# Patient Record
Sex: Female | Born: 1970 | Race: Asian | Hispanic: No | Marital: Married | Smoking: Never smoker
Health system: Southern US, Community
[De-identification: ages and names within clinical notes are randomized; demographics above are authoritative.]

## PROBLEM LIST (undated history)

## (undated) DIAGNOSIS — I10 Essential (primary) hypertension: Secondary | ICD-10-CM

## (undated) HISTORY — DX: Essential (primary) hypertension: I10

---

## 2005-08-19 ENCOUNTER — Inpatient Hospital Stay (HOSPITAL_COMMUNITY): Admission: AD | Admit: 2005-08-19 | Discharge: 2005-08-21 | Payer: Self-pay | Admitting: *Deleted

## 2006-01-04 ENCOUNTER — Emergency Department (HOSPITAL_COMMUNITY): Admission: AD | Admit: 2006-01-04 | Discharge: 2006-01-04 | Payer: Self-pay | Admitting: Family Medicine

## 2006-01-31 ENCOUNTER — Emergency Department (HOSPITAL_COMMUNITY): Admission: EM | Admit: 2006-01-31 | Discharge: 2006-01-31 | Payer: Self-pay | Admitting: Family Medicine

## 2006-05-09 ENCOUNTER — Emergency Department (HOSPITAL_COMMUNITY): Admission: EM | Admit: 2006-05-09 | Discharge: 2006-05-09 | Payer: Self-pay | Admitting: Family Medicine

## 2006-09-26 ENCOUNTER — Emergency Department (HOSPITAL_COMMUNITY): Admission: EM | Admit: 2006-09-26 | Discharge: 2006-09-26 | Payer: Self-pay | Admitting: Emergency Medicine

## 2006-10-30 ENCOUNTER — Emergency Department (HOSPITAL_COMMUNITY): Admission: EM | Admit: 2006-10-30 | Discharge: 2006-10-30 | Payer: Self-pay | Admitting: Family Medicine

## 2006-11-02 ENCOUNTER — Emergency Department (HOSPITAL_COMMUNITY): Admission: EM | Admit: 2006-11-02 | Discharge: 2006-11-02 | Payer: Self-pay | Admitting: Emergency Medicine

## 2010-01-02 ENCOUNTER — Emergency Department (HOSPITAL_COMMUNITY): Admission: EM | Admit: 2010-01-02 | Discharge: 2010-01-02 | Payer: Self-pay | Admitting: Emergency Medicine

## 2010-05-30 HISTORY — PX: ANKLE SURGERY: SHX546

## 2010-06-22 ENCOUNTER — Inpatient Hospital Stay (HOSPITAL_COMMUNITY): Admission: EM | Admit: 2010-06-22 | Discharge: 2010-06-24 | Payer: Self-pay | Admitting: Emergency Medicine

## 2010-12-12 LAB — CBC
HCT: 41.7 % (ref 36.0–46.0)
Hemoglobin: 12.2 g/dL (ref 12.0–15.0)
Hemoglobin: 12.9 g/dL (ref 12.0–15.0)
Hemoglobin: 14 g/dL (ref 12.0–15.0)
MCH: 26.1 pg (ref 26.0–34.0)
MCH: 26.2 pg (ref 26.0–34.0)
MCH: 26.5 pg (ref 26.0–34.0)
MCHC: 32.6 g/dL (ref 30.0–36.0)
MCHC: 33.1 g/dL (ref 30.0–36.0)
MCHC: 33.6 g/dL (ref 30.0–36.0)
MCV: 78.8 fL (ref 78.0–100.0)
Platelets: 241 10*3/uL (ref 150–400)

## 2010-12-12 LAB — PROTIME-INR
Prothrombin Time: 13.4 seconds (ref 11.6–15.2)
Prothrombin Time: 19.9 seconds — ABNORMAL HIGH (ref 11.6–15.2)

## 2010-12-12 LAB — BASIC METABOLIC PANEL
BUN: 3 mg/dL — ABNORMAL LOW (ref 6–23)
CO2: 25 mEq/L (ref 19–32)
CO2: 26 mEq/L (ref 19–32)
Calcium: 8.4 mg/dL (ref 8.4–10.5)
Creatinine, Ser: 0.59 mg/dL (ref 0.4–1.2)
Glucose, Bld: 103 mg/dL — ABNORMAL HIGH (ref 70–99)
Glucose, Bld: 158 mg/dL — ABNORMAL HIGH (ref 70–99)
Potassium: 3.6 mEq/L (ref 3.5–5.1)
Sodium: 135 mEq/L (ref 135–145)

## 2011-04-28 ENCOUNTER — Other Ambulatory Visit: Payer: Self-pay | Admitting: Obstetrics & Gynecology

## 2011-04-28 DIAGNOSIS — Z1231 Encounter for screening mammogram for malignant neoplasm of breast: Secondary | ICD-10-CM

## 2011-05-30 ENCOUNTER — Ambulatory Visit
Admission: RE | Admit: 2011-05-30 | Discharge: 2011-05-30 | Disposition: A | Discharge status: Home / Self Care | Payer: BC Managed Care – PPO | Source: Ambulatory Visit | Attending: Obstetrics & Gynecology | Admitting: Obstetrics & Gynecology

## 2011-05-30 DIAGNOSIS — Z1231 Encounter for screening mammogram for malignant neoplasm of breast: Secondary | ICD-10-CM

## 2011-08-12 IMAGING — CR DG TIBIA/FIBULA 2V*R*
2 series · 2 of 2 positions shown · non-contrast
Comparison: None.

CLINICAL DATA: Martial arts injury, fell, pain

RIGHT TIBIA AND FIBULA - 2 VIEW

[view not recorded (1 of 2)]
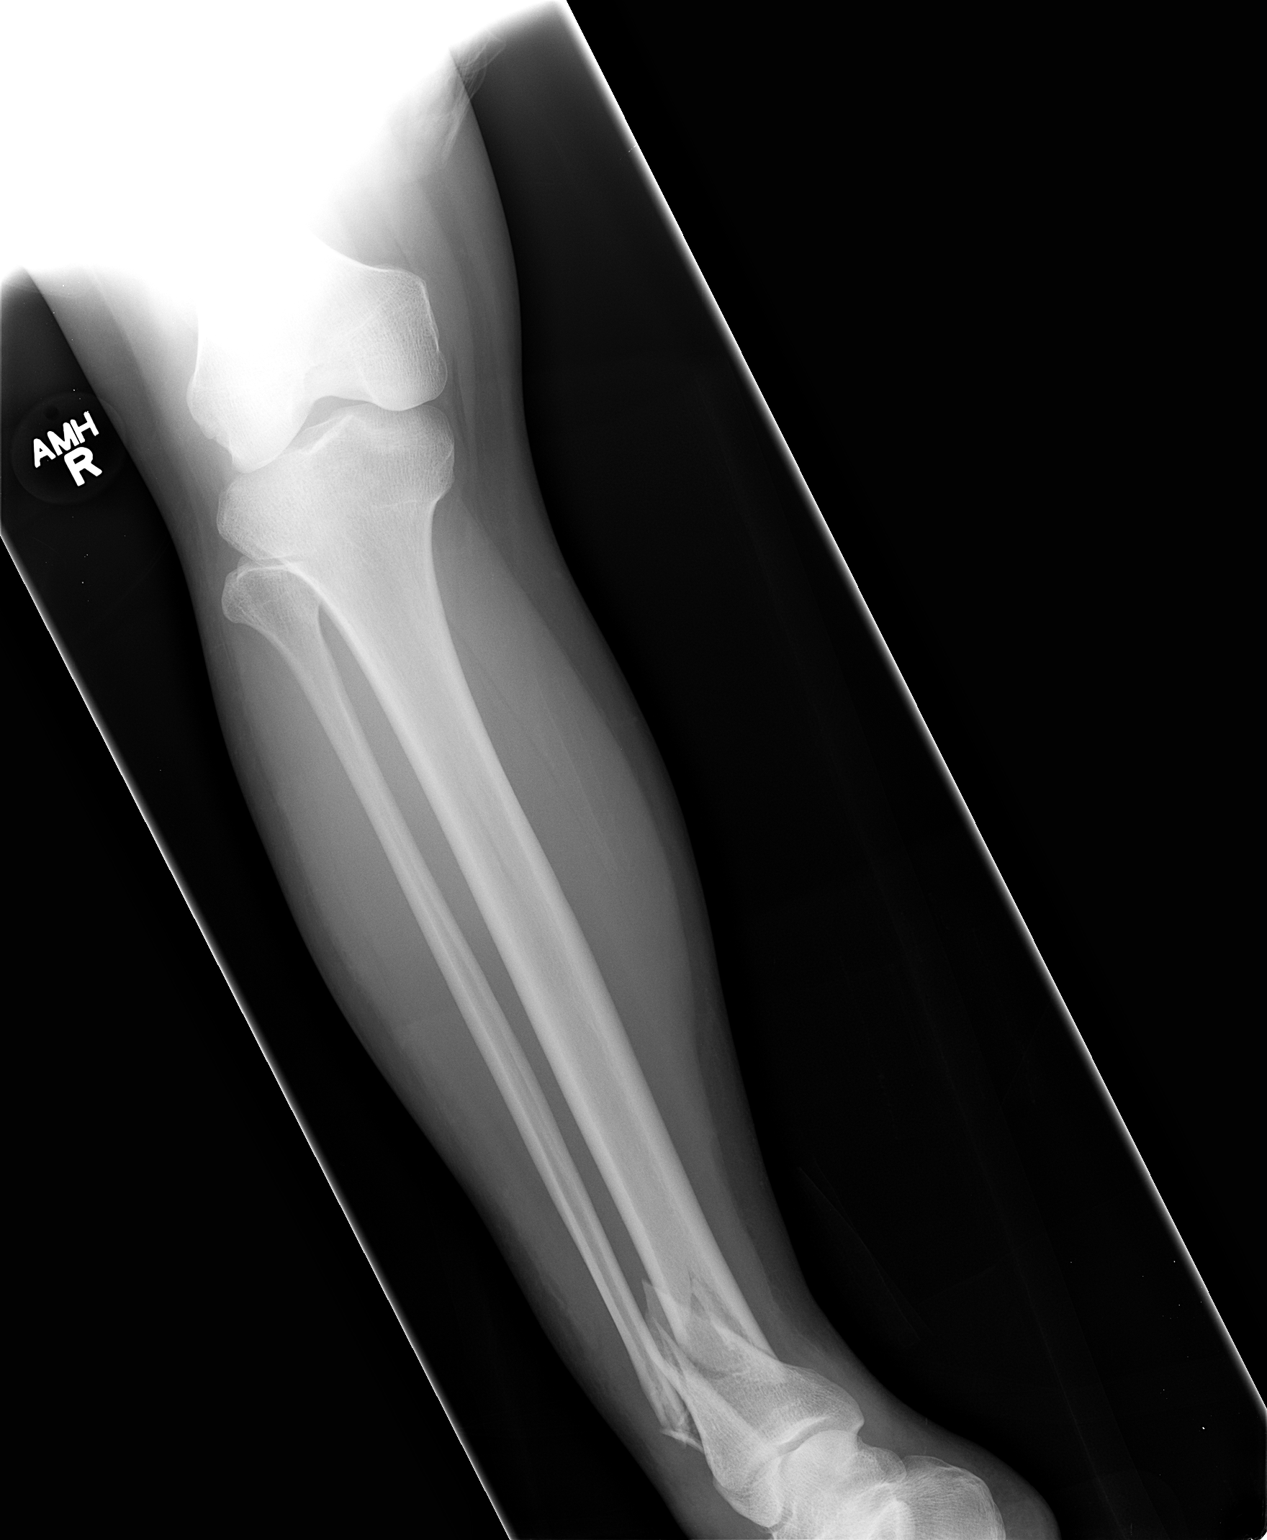

[view not recorded (2 of 2)]
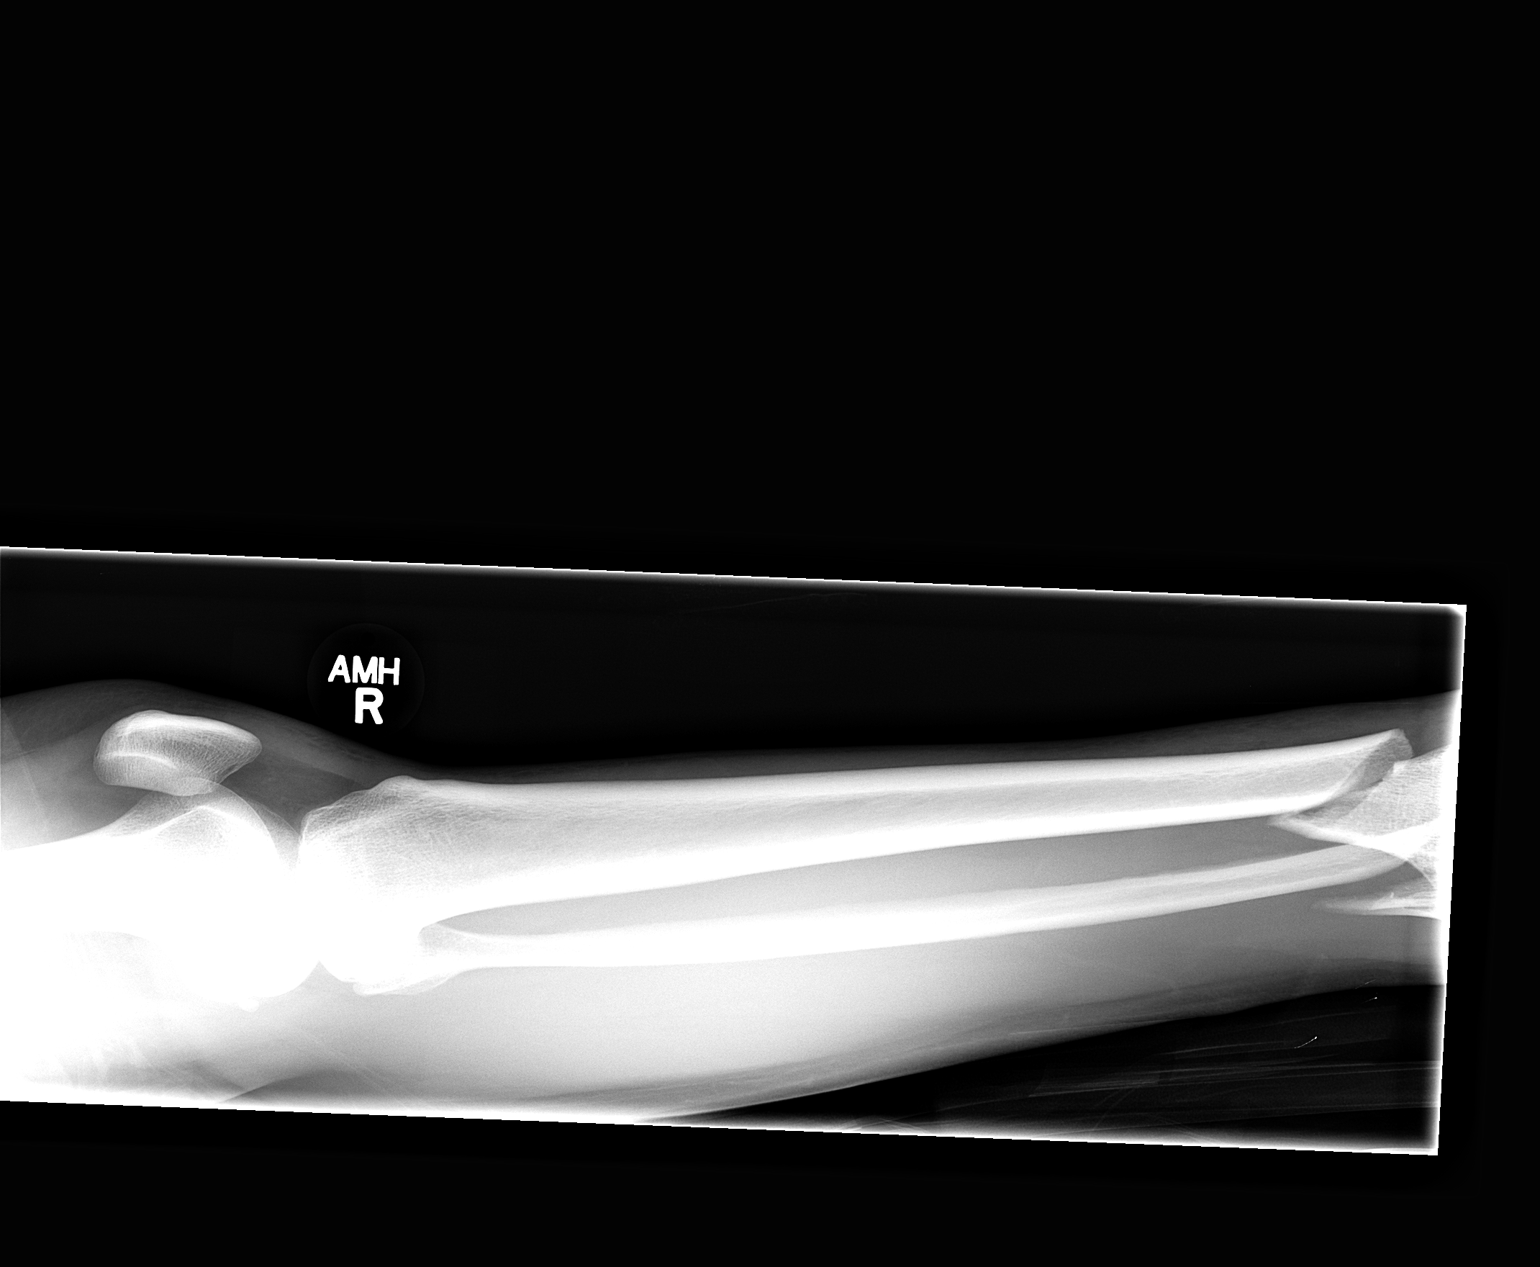

[2 of 2 positions shown; findings below may reference images not displayed]

FINDINGS: There are distal tibial and fibular fractures which are
comminuted and angulated.  Soft tissue swelling is present.
IMPRESSION: As above.

## 2011-08-12 IMAGING — CR DG ANKLE 2V *R*
2 series · 2 of 2 positions shown · non-contrast
Comparison: None.

CLINICAL DATA: Fell, pain

RIGHT ANKLE - 2 VIEW

[view not recorded (1 of 2)]
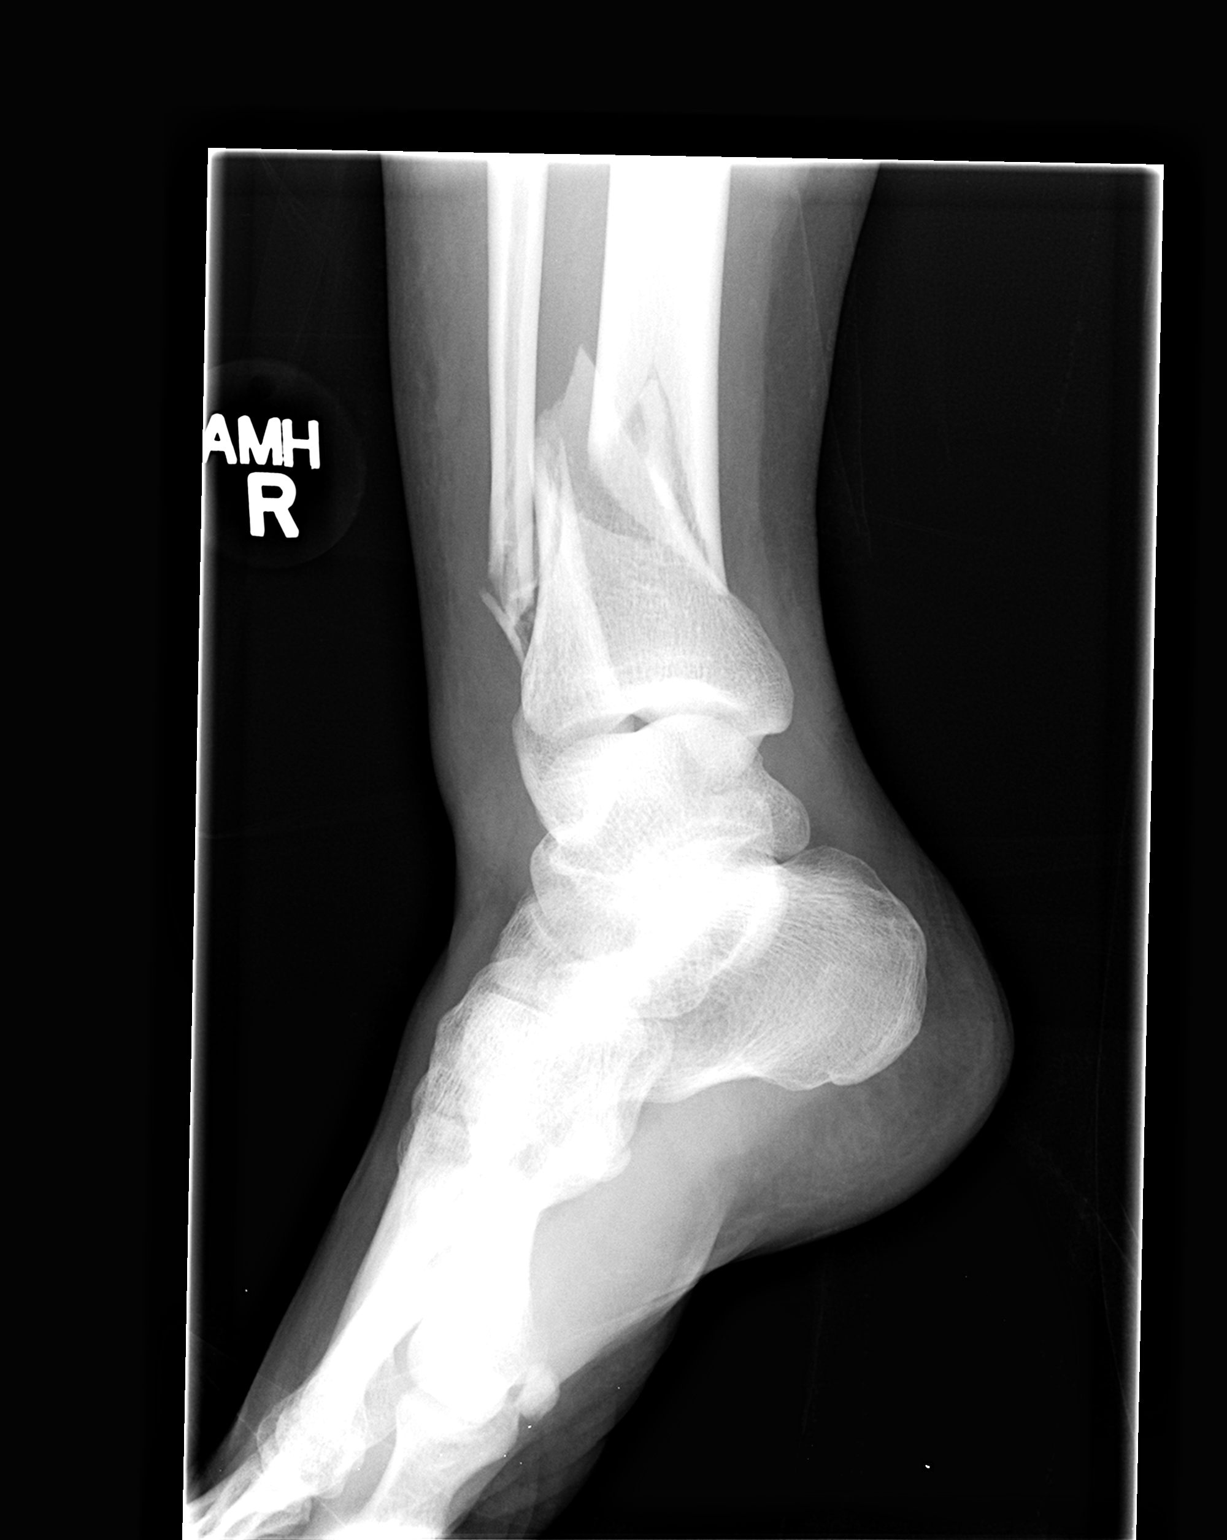

[view not recorded (2 of 2)]
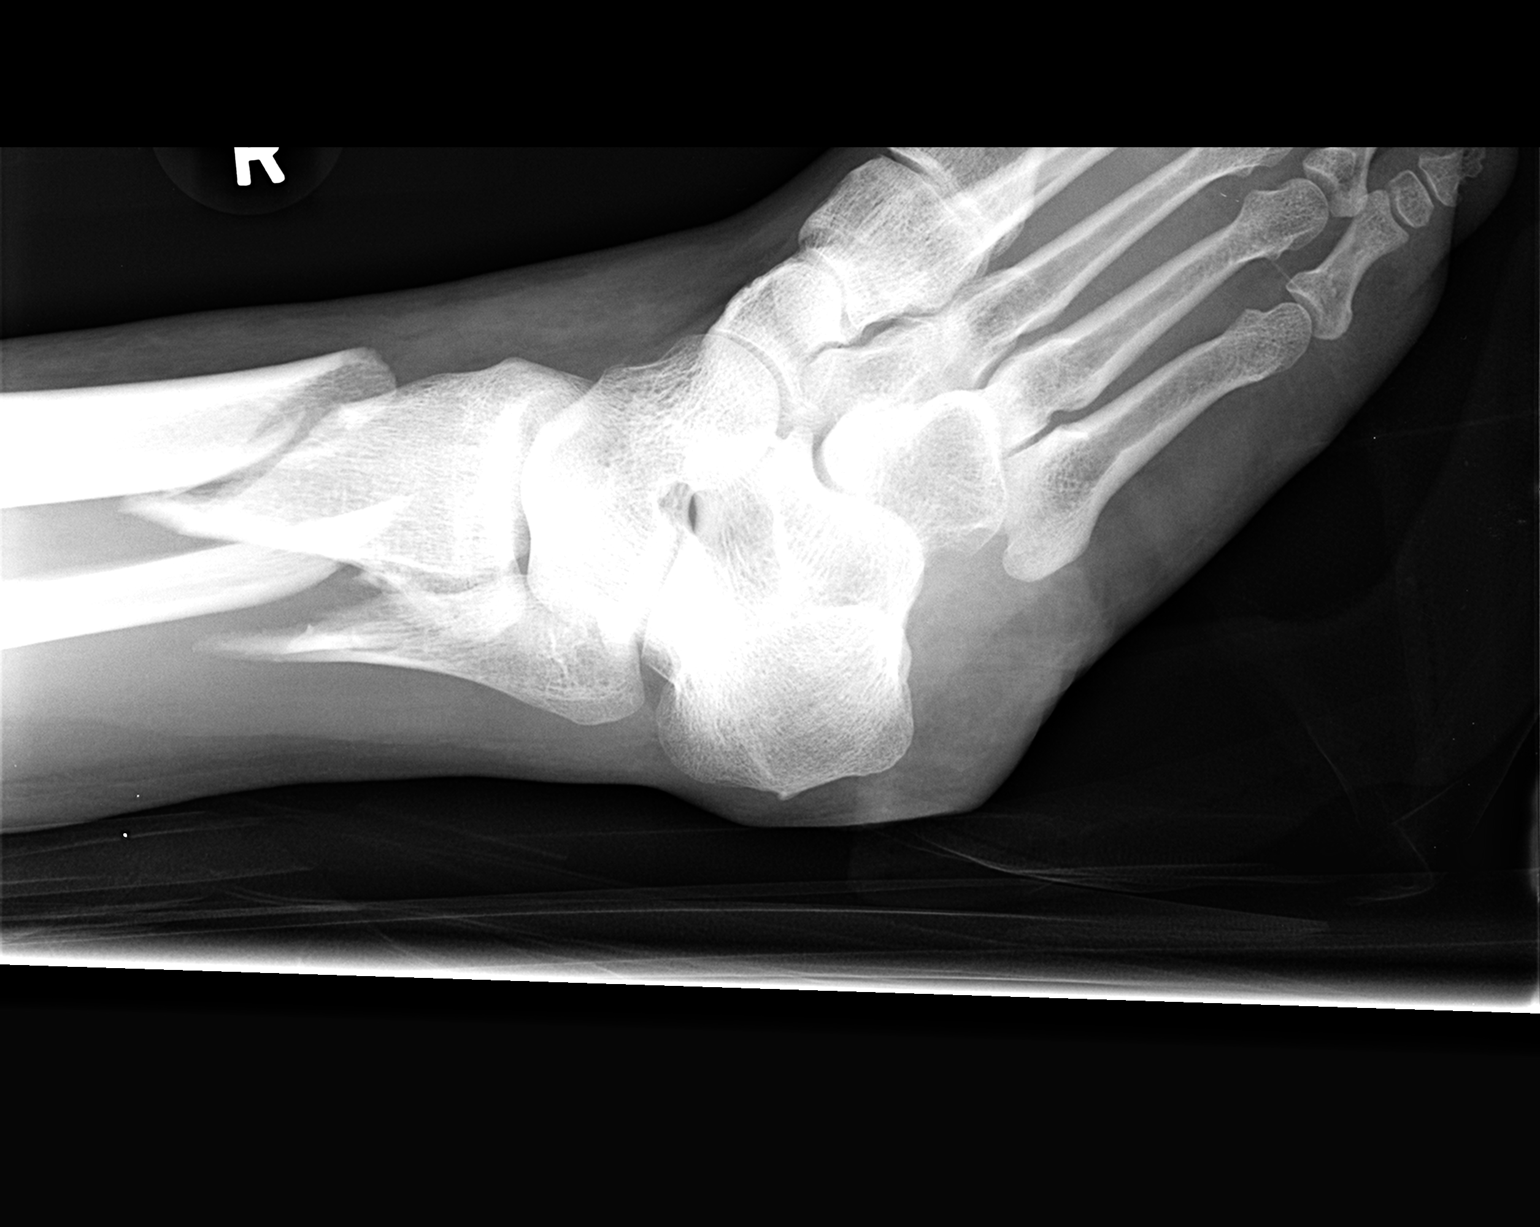

[2 of 2 positions shown; findings below may reference images not displayed]

FINDINGS: Comminuted distal tibia and fibular fractures with
angulation.  Overriding fracture fragments.  Ankle mortise intact.
IMPRESSION: As above.

## 2012-05-28 ENCOUNTER — Other Ambulatory Visit: Payer: Self-pay | Admitting: Obstetrics & Gynecology

## 2012-05-28 DIAGNOSIS — Z1231 Encounter for screening mammogram for malignant neoplasm of breast: Secondary | ICD-10-CM

## 2012-06-23 ENCOUNTER — Ambulatory Visit
Admission: RE | Admit: 2012-06-23 | Discharge: 2012-06-23 | Disposition: A | Discharge status: Home / Self Care | Payer: BC Managed Care – PPO | Source: Ambulatory Visit | Attending: Obstetrics & Gynecology | Admitting: Obstetrics & Gynecology

## 2012-06-23 DIAGNOSIS — Z1231 Encounter for screening mammogram for malignant neoplasm of breast: Secondary | ICD-10-CM

## 2013-05-20 ENCOUNTER — Other Ambulatory Visit: Payer: Self-pay

## 2013-05-20 DIAGNOSIS — Z1231 Encounter for screening mammogram for malignant neoplasm of breast: Secondary | ICD-10-CM

## 2013-06-24 ENCOUNTER — Ambulatory Visit
Admission: RE | Admit: 2013-06-24 | Discharge: 2013-06-24 | Disposition: A | Discharge status: Home / Self Care | Payer: BC Managed Care – PPO | Source: Ambulatory Visit

## 2013-06-24 DIAGNOSIS — Z1231 Encounter for screening mammogram for malignant neoplasm of breast: Secondary | ICD-10-CM

## 2014-05-29 ENCOUNTER — Other Ambulatory Visit: Payer: Self-pay

## 2014-05-29 DIAGNOSIS — Z1231 Encounter for screening mammogram for malignant neoplasm of breast: Secondary | ICD-10-CM

## 2014-06-26 ENCOUNTER — Ambulatory Visit
Admission: RE | Admit: 2014-06-26 | Discharge: 2014-06-26 | Disposition: A | Discharge status: Home / Self Care | Payer: BC Managed Care – PPO | Source: Ambulatory Visit

## 2014-06-26 DIAGNOSIS — Z1231 Encounter for screening mammogram for malignant neoplasm of breast: Secondary | ICD-10-CM

## 2015-02-13 LAB — HM COLONOSCOPY

## 2015-03-05 ENCOUNTER — Other Ambulatory Visit: Payer: Self-pay | Admitting: Surgery

## 2015-03-05 NOTE — H&P (Signed)
Tammy Rowe 03/05/2015 8:47 AM Location: [facility] Patient #: [id number] DOB: 1970-10-09 Married / Language: [place] / Race: Undefined Female  Patient Care Team: Shirlean Mylararol Webb, MD as PCP - General (Family Medicine) Karie SodaSteven Devonia Farro, MD as Consulting Physician (General Surgery) Graylin ShiverSalem F Ganem, MD as Consulting Physician (Gastroenterology)   History of Present Illness Ardeth Sportsman(Giavonni Fonder C. Izyan Ezzell MD; 03/05/2015 9:29 AM) The patient is a 44 year old female who presents with a colorectal polyp. Patient sent by her gastroenterologist, Dr. Evette CristalGanem, for concern of a rectal mass. Pleasant active female. Martial Ecologistarts instructor. Struggled with intermittent rectal bleeding and hemorrhoids for many years. Felt to be hemorrhoids. Increasing in frequency. Some irritation. Usually Tucks pads and occasionally Preparation H help. Because of persistent rectal bleeding, she was sent to gastroenterology. Colonoscopy revealed large anorectal mass. Questionable polyp versus hemorrhoid. Rest of colonoscopy normal. Surgical consultation requested. Patient usually has a bowel movement 3 times a day. No bad bouts of constipation or diarrhea. She is very physically active. No prior anorectal interventions. No history of Crohn's. No cellulitis. No inflammatory bowel disease. No irritable bowel syndrome. No history of ulcers or esophagitis. Otherwise very healthy.  Other Problems Ethlyn Gallery(Alisha Spillers, CMA; 03/05/2015 8:47 AM) Hemorrhoids Migraine Headache  Past Surgical History Elease Hashimoto(Alisha Spillers, CMA; 03/05/2015 8:47 AM) Foot Surgery Right.  Diagnostic Studies History Ethlyn Gallery(Alisha Spillers, CMA; 03/05/2015 8:47 AM) Colonoscopy within last year Mammogram within last year Pap Smear 1-5 years ago  Allergies Elease Hashimoto(Alisha Spillers, CMA; 03/05/2015 8:51 AM) Percocet *ANALGESICS - OPIOID* Rash, Itching.  Medication History (Alisha Spillers, CMA; 03/05/2015 8:51 AM) Multivitamins (Oral) Active. Medications  Reconciled  Social History Ethlyn Gallery(Alisha Spillers, CMA; 03/05/2015 8:47 AM) Alcohol use Occasional alcohol use. Caffeine use Carbonated beverages, Coffee, Tea. No drug use Tobacco use Never smoker.  Family History Ethlyn Gallery(Alisha Spillers, CMA; 03/05/2015 8:47 AM) Diabetes Mellitus Mother. Hypertension Mother.  Pregnancy / Birth History Ethlyn Gallery(Alisha Spillers, CMA; 03/05/2015 8:47 AM) Age at menarche 14 years. Contraceptive History Intrauterine device. Gravida 2 Maternal age 44-35 Para 2 Regular periods     Review of Systems Elease Hashimoto(Alisha Spillers CMA; 03/05/2015 8:47 AM) General Not Present- Appetite Loss, Chills, Fatigue, Fever, Night Sweats, Weight Gain and Weight Loss. Skin Not Present- Change in Wart/Mole, Dryness, Hives, Jaundice, New Lesions, Non-Healing Wounds, Rash and Ulcer. HEENT Not Present- Earache, Hearing Loss, Hoarseness, Nose Bleed, Oral Ulcers, Ringing in the Ears, Seasonal Allergies, Sinus Pain, Sore Throat, Visual Disturbances, Wears glasses/contact lenses and Yellow Eyes. Respiratory Not Present- Bloody sputum, Chronic Cough, Difficulty Breathing, Snoring and Wheezing. Breast Not Present- Breast Mass, Breast Pain, Nipple Discharge and Skin Changes. Cardiovascular Not Present- Chest Pain, Difficulty Breathing Lying Down, Leg Cramps, Palpitations, Rapid Heart Rate, Shortness of Breath and Swelling of Extremities. Gastrointestinal Present- Hemorrhoids. Not Present- Abdominal Pain, Bloating, Bloody Stool, Change in Bowel Habits, Chronic diarrhea, Constipation, Difficulty Swallowing, Excessive gas, Gets full quickly at meals, Indigestion, Nausea, Rectal Pain and Vomiting. Female Genitourinary Not Present- Frequency, Nocturia, Painful Urination, Pelvic Pain and Urgency. Musculoskeletal Not Present- Back Pain, Joint Pain, Joint Stiffness, Muscle Pain, Muscle Weakness and Swelling of Extremities. Neurological Not Present- Decreased Memory, Fainting, Headaches, Numbness, Seizures, Tingling,  Tremor, Trouble walking and Weakness. Psychiatric Not Present- Anxiety, Bipolar, Change in Sleep Pattern, Depression, Fearful and Frequent crying. Endocrine Not Present- Cold Intolerance, Excessive Hunger, Hair Changes, Heat Intolerance, Hot flashes and New Diabetes. Hematology Not Present- Easy Bruising, Excessive bleeding, Gland problems, HIV and Persistent Infections.   Physical Exam Ardeth Sportsman(Jessiah Steinhart C. Abimelec Grochowski MD; 03/05/2015 9:15 AM)  General Mental Status-Alert.  General Appearance-Not in acute distress, Not Sickly. Orientation-Oriented X3. Hydration-Well hydrated. Voice-Normal.  Integumentary Global Assessment Upon inspection and palpation of skin surfaces of the - Axillae: non-tender, no inflammation or ulceration, no drainage. and Distribution of scalp and body hair is normal. General Characteristics Temperature - normal warmth is noted.  Head and Neck Head-normocephalic, atraumatic with no lesions or palpable masses. Face Global Assessment - atraumatic, no absence of expression. Neck Global Assessment - no abnormal movements, no bruit auscultated on the right, no bruit auscultated on the left, no decreased range of motion, non-tender. Trachea-midline. Thyroid Gland Characteristics - non-tender.  Eye Eyeball - Left-Extraocular movements intact, No Nystagmus. Eyeball - Right-Extraocular movements intact, No Nystagmus. Cornea - Left-No Hazy. Cornea - Right-No Hazy. Sclera/Conjunctiva - Left-No scleral icterus, No Discharge. Sclera/Conjunctiva - Right-No scleral icterus, No Discharge. Pupil - Left-Direct reaction to light normal. Pupil - Right-Direct reaction to light normal.  ENMT Ears Pinna - Left - no drainage observed, no generalized tenderness observed. Right - no drainage observed, no generalized tenderness observed. Nose and Sinuses External Inspection of the Nose - no destructive lesion observed. Inspection of the nares - Left - quiet  respiration. Right - quiet respiration. Mouth and Throat Lips - Upper Lip - no fissures observed, no pallor noted. Lower Lip - no fissures observed, no pallor noted. Nasopharynx - no discharge present. Oral Cavity/Oropharynx - Tongue - no dryness observed. Oral Mucosa - no cyanosis observed. Hypopharynx - no evidence of airway distress observed.  Chest and Lung Exam Inspection Movements - Normal and Symmetrical. Accessory muscles - No use of accessory muscles in breathing. Palpation Palpation of the chest reveals - Non-tender. Auscultation Breath sounds - Normal and Clear.  Cardiovascular Auscultation Rhythm - Regular. Murmurs & Other Heart Sounds - Auscultation of the heart reveals - No Murmurs and No Systolic Clicks.  Abdomen Inspection Inspection of the abdomen reveals - No Visible peristalsis and No Abnormal pulsations. Umbilicus - No Bleeding, No Urine drainage. Palpation/Percussion Palpation and Percussion of the abdomen reveal - Soft, Non Tender, No Rebound tenderness, No Rigidity (guarding) and No Cutaneous hyperesthesia. Note: Abdomen soft and flat. Mild linea nigra. No umbilical hernia. No diastases.   Female Genitourinary Sexual Maturity Tanner 5 - Adult hair pattern. Note: No vaginal bleeding nor discharge   Rectal Note: Perianal skin clear. Obviously prolapsed hemorrhoid. RIGHT anterior. Prolapsed markedly. Large internal RIGHT posterior hemorrhoid partially prolapses. LEFT lateral pile mildly enlarged. No fissure. No fistula. No abscess.   Peripheral Vascular Upper Extremity Inspection - Left - No Cyanotic nailbeds, Not Ischemic. Right - No Cyanotic nailbeds, Not Ischemic.  Neurologic Neurologic evaluation reveals -normal attention span and ability to concentrate, able to name objects and repeat phrases. Appropriate fund of knowledge , normal sensation and normal coordination. Mental Status Affect - not angry, not paranoid. Cranial Nerves-Normal  Bilaterally. Gait-Normal.  Neuropsychiatric Mental status exam performed with findings of-able to articulate well with normal speech/language, rate, volume and coherence, thought content normal with ability to perform basic computations and apply abstract reasoning and no evidence of hallucinations, delusions, obsessions or homicidal/suicidal ideation.  Musculoskeletal Global Assessment Spine, Ribs and Pelvis - no instability, subluxation or laxity. Right Upper Extremity - no instability, subluxation or laxity.  Lymphatic Head & Neck  General Head & Neck Lymphatics: Bilateral - Description - No Localized lymphadenopathy. Axillary  General Axillary Region: Bilateral - Description - No Localized lymphadenopathy. Femoral & Inguinal  Generalized Femoral & Inguinal Lymphatics: Left - Description - No Localized lymphadenopathy. Right -  Description - No Localized lymphadenopathy.    Assessment & Plan Ardeth Sportsman MD; 03/05/2015 9:25 AM)  PROLAPSED INTERNAL HEMORRHOIDS, GRADE 4 (455.2  K64.3) Impression: Enlarged hemorrhoids. RIGHT anterior chronically partially prolapsed. Very large. RIGHT posterior easily prolapses. Grade 3?4. I do not think that this is amenable to banding only. I think surgery really is her only option to help correct her anatomy.  Reasonable to do [facility] hemorrhoidal ligation/pexy and then remove remaining tissue. Did caution her it will take several weeks to recover to the point of getting back to her intense martial arts activity. She her husband agreed to proceed.  Current Plans Schedule for Surgery Pt Education - [facility] Education - Written: discussed with patient and provided information. Pt Education - [facility] Hemorrhoids (Emillio Ngo) Pt Education - [facility] Rectal Surgery HCI (Benjy Kana): discussed with patient and provided information. The anatomy & physiology of the anorectal region was discussed. The pathophysiology of hemorrhoids and differential diagnosis was discussed.  Natural history risks without surgery was discussed. I stressed the importance of a bowel regimen to have daily soft bowel movements to minimize progression of disease. Interventions such as sclerotherapy & banding were discussed.  The patient's symptoms are not adequately controlled by medicines and other non-operative treatments. I feel the risks & problems of no surgery outweigh the operative risks; therefore, I recommended surgery to treat the hemorrhoids by ligation, pexy, and possible resection.  Risks such as bleeding, infection, urinary difficulties, need for further treatment, heart attack, death, and other risks were discussed. I noted a good likelihood this will help address the problem. Goals of post-operative recovery were discussed as well. Possibility that this will not correct all symptoms was explained. Post-operative pain, bleeding, constipation, and other problems after surgery were discussed. We will work to minimize complications. Educational handouts further explaining the pathology, treatment options, and bowel regimen were given as well. Questions were answered. The patient expresses understanding & wishes to proceed with surgery.  Ardeth Sportsman, M.D., F.A.C.S. Gastrointestinal and Minimally Invasive Surgery [facility] [address], [address] [place], [place] [address] [phone] Main / Paging

## 2016-01-04 ENCOUNTER — Other Ambulatory Visit: Payer: Self-pay | Admitting: Obstetrics and Gynecology

## 2016-01-04 DIAGNOSIS — N6310 Unspecified lump in the right breast, unspecified quadrant: Secondary | ICD-10-CM

## 2016-01-10 ENCOUNTER — Ambulatory Visit
Admission: RE | Admit: 2016-01-10 | Discharge: 2016-01-10 | Disposition: A | Discharge status: Home / Self Care | Payer: BLUE CROSS/BLUE SHIELD | Source: Ambulatory Visit | Attending: Obstetrics and Gynecology | Admitting: Obstetrics and Gynecology

## 2016-01-10 DIAGNOSIS — N6310 Unspecified lump in the right breast, unspecified quadrant: Secondary | ICD-10-CM

## 2019-12-16 ENCOUNTER — Ambulatory Visit: Payer: Self-pay | Attending: Internal Medicine

## 2019-12-16 DIAGNOSIS — Z23 Encounter for immunization: Secondary | ICD-10-CM

## 2019-12-16 NOTE — Progress Notes (Signed)
   Covid-19 Vaccination Clinic  Name:  Tammy Rowe    MRN: [id number] DOB: 03-04-1971  12/16/2019  [name] was observed post Covid-19 immunization for 15 minutes without incident. She was provided with Vaccine Information Sheet and instruction to access the V-Safe system.   [name] was instructed to call 911 with any severe reactions post vaccine: Marland Kitchen Difficulty breathing  . Swelling of face and throat  . A fast heartbeat  . A bad rash all over body  . Dizziness and weakness   Immunizations Administered    Name Date Dose VIS Date Route   Pfizer COVID-19 Vaccine 12/16/2019  9:28 AM 0.3 mL 09/09/2019 Intramuscular   Manufacturer: [employer], [employer]   Lot: [id number]   NDC: [address]-0

## 2019-12-29 ENCOUNTER — Ambulatory Visit: Payer: No Typology Code available for payment source

## 2020-01-10 ENCOUNTER — Ambulatory Visit: Payer: No Typology Code available for payment source | Attending: Internal Medicine

## 2020-01-10 DIAGNOSIS — Z23 Encounter for immunization: Secondary | ICD-10-CM

## 2020-01-10 NOTE — Progress Notes (Signed)
   Covid-19 Vaccination Clinic  Name:  Tammy Rowe    MRN: [id number] DOB: 1971-04-26  01/10/2020  Tammy Rowe was observed post Covid-19 immunization for 15 minutes without incident. She was provided with Vaccine Information Sheet and instruction to access the V-Safe system.   Tammy Rowe was instructed to call 911 with any severe reactions post vaccine: Marland Kitchen Difficulty breathing  . Swelling of face and throat  . A fast heartbeat  . A bad rash all over body  . Dizziness and weakness   Immunizations Administered    Name Date Dose VIS Date Route   Pfizer COVID-19 Vaccine 01/10/2020 10:06 AM 0.3 mL 09/09/2019 Intramuscular   Manufacturer: [employer], [employer]   Lot: [id number]   NDC: [address]-2

## 2022-08-04 ENCOUNTER — Other Ambulatory Visit (HOSPITAL_COMMUNITY): Payer: Self-pay | Admitting: Family Medicine

## 2022-08-04 DIAGNOSIS — E7849 Other hyperlipidemia: Secondary | ICD-10-CM

## 2022-09-26 ENCOUNTER — Ambulatory Visit (HOSPITAL_BASED_OUTPATIENT_CLINIC_OR_DEPARTMENT_OTHER)
Admission: RE | Admit: 2022-09-26 | Discharge: 2022-09-26 | Disposition: A | Discharge status: Home / Self Care | Payer: Self-pay | Source: Ambulatory Visit | Attending: Family Medicine | Admitting: Family Medicine

## 2022-09-26 DIAGNOSIS — E7849 Other hyperlipidemia: Secondary | ICD-10-CM | POA: Insufficient documentation

## 2023-05-25 ENCOUNTER — Other Ambulatory Visit: Payer: Self-pay | Admitting: Family Medicine

## 2023-05-25 DIAGNOSIS — Z1231 Encounter for screening mammogram for malignant neoplasm of breast: Secondary | ICD-10-CM

## 2023-05-28 ENCOUNTER — Ambulatory Visit (HOSPITAL_BASED_OUTPATIENT_CLINIC_OR_DEPARTMENT_OTHER)
Admission: RE | Admit: 2023-05-28 | Discharge: 2023-05-28 | Disposition: A | Discharge status: Home / Self Care | Payer: No Typology Code available for payment source | Source: Ambulatory Visit

## 2023-05-28 DIAGNOSIS — Z1231 Encounter for screening mammogram for malignant neoplasm of breast: Secondary | ICD-10-CM | POA: Diagnosis present

## 2023-05-29 ENCOUNTER — Encounter (HOSPITAL_BASED_OUTPATIENT_CLINIC_OR_DEPARTMENT_OTHER): Payer: No Typology Code available for payment source | Admitting: Radiology

## 2023-05-29 DIAGNOSIS — Z1231 Encounter for screening mammogram for malignant neoplasm of breast: Secondary | ICD-10-CM

## 2023-07-02 ENCOUNTER — Encounter (HOSPITAL_BASED_OUTPATIENT_CLINIC_OR_DEPARTMENT_OTHER): Payer: Self-pay | Admitting: Obstetrics & Gynecology

## 2023-07-02 ENCOUNTER — Other Ambulatory Visit (HOSPITAL_COMMUNITY)
Admission: RE | Admit: 2023-07-02 | Discharge: 2023-07-02 | Disposition: A | Discharge status: Home / Self Care | Payer: No Typology Code available for payment source | Source: Ambulatory Visit | Attending: Obstetrics & Gynecology | Admitting: Obstetrics & Gynecology

## 2023-07-02 ENCOUNTER — Ambulatory Visit (HOSPITAL_BASED_OUTPATIENT_CLINIC_OR_DEPARTMENT_OTHER): Payer: No Typology Code available for payment source | Admitting: Obstetrics & Gynecology

## 2023-07-02 VITALS — BP 122/88 | HR 86 | Ht <= 58 in | Wt 106.0 lb

## 2023-07-02 DIAGNOSIS — Z124 Encounter for screening for malignant neoplasm of cervix: Secondary | ICD-10-CM | POA: Insufficient documentation

## 2023-07-02 DIAGNOSIS — Z975 Presence of (intrauterine) contraceptive device: Secondary | ICD-10-CM

## 2023-07-02 DIAGNOSIS — N912 Amenorrhea, unspecified: Secondary | ICD-10-CM

## 2023-07-02 DIAGNOSIS — Z01419 Encounter for gynecological examination (general) (routine) without abnormal findings: Secondary | ICD-10-CM | POA: Diagnosis not present

## 2023-07-02 NOTE — Progress Notes (Signed)
52 y.o. G10P1102 Married [place] female here for annual exam/new patient.  Denies vaginal bleeding.  Had a Mirena IUD 03/2019.  She's had hot flashes and does get hot at night.  She is on estroven.  Feels this has helped.  We discussed some other options for treatment including estrogen, veozah, gabapentin, paxil.  She feels hot flashes are under good control but she some have some mood changes.    No LMP recorded. (Menstrual status: IUD).          Sexually active: Yes.    The current method of family planning is IUD.    Exercising: Yes.    Smoker:  no  Health Maintenance: Pap:  will obtain today History of abnormal Pap:  no MMG:  05/29/2023 Colonoscopy:  2016 follow up 10 years BMD:   guidelines reviewed Screening Labs: does with PCP   reports that she has never smoked. She has never used smokeless tobacco. She reports that she does not drink alcohol and does not use drugs.  Past Medical History:  Diagnosis Date   Hypertension     Past Surgical History:  Procedure Laterality Date   ANKLE SURGERY Right 05/30/2010   fracture repair, has hardware    Current Outpatient Medications  Medication Sig Dispense Refill   levonorgestrel (MIRENA) 20 MCG/DAY IUD 1 each by Intrauterine route once.     montelukast (SINGULAIR) 10 MG tablet Take 10 mg by mouth at bedtime.     Multiple Vitamins-Minerals (CENTRUM SILVER 50+WOMEN) TABS Take 1 tablet by mouth daily in the afternoon.     Omega-3 Fatty Acids (FISH OIL PO) Take 1 tablet by mouth daily in the afternoon.     Rhubarb (ESTROVEN MENOPAUSE RELIEF PO) Take 1 tablet by mouth daily.     amLODipine (NORVASC) 5 MG tablet Take 5 mg by mouth daily.     No current facility-administered medications for this visit.    Family History  Problem Relation Age of Onset   Stroke Father    Diabetes Mother     ROS: Constitutional: negative Genitourinary:negative  Exam:   BP (!) 152/101 (BP Location: Left Arm, Patient Position: Sitting, Cuff Size:  Normal)   Pulse 86      General appearance: alert, cooperative and appears stated age Head: Normocephalic, without obvious abnormality, atraumatic Neck: no adenopathy, supple, symmetrical, trachea midline and thyroid normal to inspection and palpation Lungs: clear to auscultation bilaterally Breasts: normal appearance, no masses or tenderness Heart: regular rate and rhythm Abdomen: soft, non-tender; bowel sounds normal; no masses,  no organomegaly Extremities: extremities normal, atraumatic, no cyanosis or edema Skin: Skin color, texture, turgor normal. No rashes or lesions Lymph nodes: Cervical, supraclavicular, and axillary nodes normal. No abnormal inguinal nodes palpated Neurologic: Grossly normal   Pelvic: External genitalia:  no lesions              Urethra:  normal appearing urethra with no masses, tenderness or lesions              Bartholins and Skenes: normal                 Vagina: normal appearing vagina with normal color and no discharge, no lesions              Cervix: no lesions              Pap taken: Yes.   Bimanual Exam:  Uterus:  normal size, contour, position, consistency, mobility, non-tender  Adnexa: normal adnexa and no mass, fullness, tenderness               Rectovaginal: Confirms               Anus:  normal sphincter tone, no lesions  Chaperone, Ina Homes, CMA, was present for exam.  Assessment/Plan: 1. Well woman exam with routine gynecological exam - Pap smear obtained today - Mammogram up to date  - Colonoscopy release signed.  Was done 2016 per records. - Bone mineral density guidelines reviewed - lab work done with PCP, Chari Manning - vaccines reviewed/updated  2. Amenorrhea - reviewed with pt this is from IUD and likely from menopausal changes.  Will plan to remove in 2028 after 8 years of use or if she desires removal sooner  3. Presence of Mirena IUD  4. Cervical cancer screening - Cytology - PAP( [place])

## 2023-07-07 LAB — CYTOLOGY - PAP
Comment: NEGATIVE
Diagnosis: UNDETERMINED — AB
High risk HPV: NEGATIVE

## 2023-07-09 ENCOUNTER — Telehealth (HOSPITAL_BASED_OUTPATIENT_CLINIC_OR_DEPARTMENT_OTHER): Payer: Self-pay | Admitting: Obstetrics & Gynecology

## 2023-07-09 NOTE — Telephone Encounter (Signed)
Patient called and left a message for Tonya to please call her .

## 2023-07-10 ENCOUNTER — Telehealth (HOSPITAL_BASED_OUTPATIENT_CLINIC_OR_DEPARTMENT_OTHER): Payer: Self-pay | Admitting: Obstetrics & Gynecology

## 2023-07-10 NOTE — Telephone Encounter (Signed)
Patient called and would like Tonya  to please call her back

## 2024-05-26 ENCOUNTER — Other Ambulatory Visit: Payer: Self-pay | Admitting: Family Medicine

## 2024-05-26 DIAGNOSIS — Z1231 Encounter for screening mammogram for malignant neoplasm of breast: Secondary | ICD-10-CM

## 2024-05-31 ENCOUNTER — Encounter (HOSPITAL_BASED_OUTPATIENT_CLINIC_OR_DEPARTMENT_OTHER): Admitting: Radiology

## 2024-05-31 DIAGNOSIS — Z1231 Encounter for screening mammogram for malignant neoplasm of breast: Secondary | ICD-10-CM

## 2024-06-04 ENCOUNTER — Ambulatory Visit (HOSPITAL_BASED_OUTPATIENT_CLINIC_OR_DEPARTMENT_OTHER)
Admission: RE | Admit: 2024-06-04 | Discharge: 2024-06-04 | Disposition: A | Discharge status: Home / Self Care | Payer: Self-pay | Source: Ambulatory Visit

## 2024-06-04 ENCOUNTER — Encounter (HOSPITAL_BASED_OUTPATIENT_CLINIC_OR_DEPARTMENT_OTHER): Payer: Self-pay | Admitting: Radiology

## 2024-06-04 DIAGNOSIS — Z1231 Encounter for screening mammogram for malignant neoplasm of breast: Secondary | ICD-10-CM | POA: Insufficient documentation

## 2024-07-04 ENCOUNTER — Ambulatory Visit (HOSPITAL_BASED_OUTPATIENT_CLINIC_OR_DEPARTMENT_OTHER): Admitting: Obstetrics & Gynecology

## 2024-10-03 NOTE — Progress Notes (Signed)
 "  ANNUAL EXAM Patient name: Tammy Rowe MRN [id number]  Date of birth: 1971-02-07 Chief Complaint:   Gynecologic Exam  History of Present Illness:   Tammy Rowe is a 54 y.o. G5P1102 Asian female being seen today for a routine annual exam.   Denies vaginal bleeding.  Mirena place 03/2019.    Depression screening was positive today.  Pt feels this is related to the holidays and does not feel like she needs any treatment or therapy.    PCP:  Anthony Hint, FNP.  No LMP recorded. (Menstrual status: IUD).   Last pap 07/02/2023. Results were: ASCUS w/ HRHPV negative. H/O abnormal pap: yes Last mammogram: 06/04/2024. Results were: normal. Family h/o breast cancer: no Last colonoscopy: 02/13/2015. Results were: abnormal 1 polyp. Family h/o colorectal cancer: no.  Pt aware this will be due again later this year.       10/04/2024    2:23 PM 07/02/2023    1:31 PM  Depression screen PHQ 2/9  Decreased Interest 1 0  Down, Depressed, Hopeless 1 0  PHQ - 2 Score 2 0  Altered sleeping 0   Tired, decreased energy 0   Change in appetite 0   Feeling bad or failure about yourself  1   Trouble concentrating 1   Moving slowly or fidgety/restless 0   Suicidal thoughts 0   PHQ-9 Score 4   Difficult doing work/chores Somewhat difficult     Review of Systems:   Pertinent items are noted in HPI Denies any bladder or bowel changes.  Denies pelvic pain.   Pertinent History Reviewed:  Reviewed past medical,surgical, social and family history.  Reviewed problem list, medications and allergies. Physical Assessment:   Vitals:   10/04/24 1418  BP: 124/82  Pulse: 100  SpO2: 100%  Weight: 109 lb 9.6 oz (49.7 kg)  Height: 4' 9 (1.448 m)  Body mass index is 23.72 kg/m.        Physical Examination:   General appearance - well appearing, and in no distress  Mental status - alert, oriented to person, place, and time  Psych:  She has a normal mood and affect  Skin - warm and dry,  normal color, no suspicious lesions noted  Chest - effort normal, all lung fields clear to auscultation bilaterally  Heart - normal rate and regular rhythm  Neck:  midline trachea, no thyromegaly or nodules  Breasts - breasts appear normal, no suspicious masses, no skin or nipple changes or  axillary nodes  Abdomen - soft, nontender, nondistended, no masses or organomegaly  Pelvic - VULVA: normal appearing vulva with no masses, tenderness or lesions   VAGINA: normal appearing vagina with normal color and discharge, no lesions   CERVIX: normal appearing cervix without discharge or lesions, no CMT  Thin prep pap is not indicated  UTERUS: uterus is felt to be normal size, shape, consistency and nontender   ADNEXA: No adnexal masses or tenderness noted.  Rectal - normal rectal, good sphincter tone, no masses felt.   Extremities:  No swelling or varicosities noted  Chaperone present for exam  No results found for this or any previous visit (from the past 24 hours).  Assessment & Plan:  1. Well woman exam with routine gynecological exam (Primary) - Pap smear ASCUS with Neg HR HPV 06/2023 - Mammogram 06/04/2024 - Colonoscopy 2016.  Pt aware due this year.  - Bone mineral density - lab work done with PCP - vaccines reviewed/updated  2. Presence of Mirena IUD -  placement was in 03/2019.  Planning to leave in placed for 8 years and remove in 2028   Follow-up: Return in about 1 year (around 10/04/2025).  Ronal GORMAN Pinal, MD 10/04/2024 3:08 PM "

## 2024-10-04 ENCOUNTER — Encounter (HOSPITAL_BASED_OUTPATIENT_CLINIC_OR_DEPARTMENT_OTHER): Payer: Self-pay | Admitting: Obstetrics & Gynecology

## 2024-10-04 ENCOUNTER — Ambulatory Visit (HOSPITAL_BASED_OUTPATIENT_CLINIC_OR_DEPARTMENT_OTHER): Admitting: Obstetrics & Gynecology

## 2024-10-04 VITALS — BP 124/82 | HR 100 | Ht <= 58 in | Wt 109.6 lb

## 2024-10-04 DIAGNOSIS — Z1331 Encounter for screening for depression: Secondary | ICD-10-CM | POA: Diagnosis not present

## 2024-10-04 DIAGNOSIS — Z975 Presence of (intrauterine) contraceptive device: Secondary | ICD-10-CM | POA: Diagnosis not present

## 2024-10-04 DIAGNOSIS — N912 Amenorrhea, unspecified: Secondary | ICD-10-CM

## 2024-10-04 DIAGNOSIS — Z01419 Encounter for gynecological examination (general) (routine) without abnormal findings: Secondary | ICD-10-CM
